# Patient Record
Sex: Male | Born: 1970 | Race: White | Hispanic: No | Marital: Single | Smoking: Current every day smoker
Health system: Southern US, Community
[De-identification: ages and names within clinical notes are randomized; demographics above are authoritative.]

## PROBLEM LIST (undated history)

## (undated) DIAGNOSIS — G47419 Narcolepsy without cataplexy: Secondary | ICD-10-CM

## (undated) DIAGNOSIS — Z21 Asymptomatic human immunodeficiency virus [HIV] infection status: Secondary | ICD-10-CM

## (undated) DIAGNOSIS — J302 Other seasonal allergic rhinitis: Secondary | ICD-10-CM

## (undated) DIAGNOSIS — F17200 Nicotine dependence, unspecified, uncomplicated: Secondary | ICD-10-CM

## (undated) DIAGNOSIS — B20 Human immunodeficiency virus [HIV] disease: Secondary | ICD-10-CM

## (undated) HISTORY — DX: Human immunodeficiency virus (HIV) disease: B20

## (undated) HISTORY — DX: Other seasonal allergic rhinitis: J30.2

## (undated) HISTORY — DX: Narcolepsy without cataplexy: G47.419

## (undated) HISTORY — DX: Asymptomatic human immunodeficiency virus (hiv) infection status: Z21

## (undated) HISTORY — DX: Nicotine dependence, unspecified, uncomplicated: F17.200

---

## 2001-04-24 ENCOUNTER — Encounter (INDEPENDENT_AMBULATORY_CARE_PROVIDER_SITE_OTHER): Payer: Self-pay | Admitting: *Deleted

## 2001-04-24 ENCOUNTER — Ambulatory Visit (HOSPITAL_COMMUNITY): Admission: RE | Admit: 2001-04-24 | Discharge: 2001-04-24 | Payer: Self-pay | Admitting: Infectious Diseases

## 2001-04-24 ENCOUNTER — Encounter: Admission: RE | Admit: 2001-04-24 | Discharge: 2001-04-24 | Payer: Self-pay | Admitting: Infectious Diseases

## 2001-04-24 LAB — CONVERTED CEMR LAB: CD4 T Cell Abs: 280

## 2001-05-15 ENCOUNTER — Ambulatory Visit (HOSPITAL_COMMUNITY): Admission: RE | Admit: 2001-05-15 | Discharge: 2001-05-15 | Payer: Self-pay | Admitting: Infectious Diseases

## 2001-05-15 ENCOUNTER — Encounter: Admission: RE | Admit: 2001-05-15 | Discharge: 2001-05-15 | Payer: Self-pay | Admitting: Internal Medicine

## 2001-05-15 ENCOUNTER — Encounter: Payer: Self-pay | Admitting: Infectious Disease

## 2001-05-15 LAB — CONVERTED CEMR LAB: HIV 1 RNA Quant: 18700 copies/mL

## 2001-07-19 ENCOUNTER — Encounter: Admission: RE | Admit: 2001-07-19 | Discharge: 2001-07-19 | Payer: Self-pay | Admitting: Infectious Diseases

## 2001-07-19 ENCOUNTER — Ambulatory Visit (HOSPITAL_COMMUNITY): Admission: RE | Admit: 2001-07-19 | Discharge: 2001-07-19 | Payer: Self-pay | Admitting: Infectious Diseases

## 2001-08-02 ENCOUNTER — Encounter: Admission: RE | Admit: 2001-08-02 | Discharge: 2001-08-02 | Payer: Self-pay | Admitting: Infectious Diseases

## 2002-03-19 ENCOUNTER — Ambulatory Visit (HOSPITAL_COMMUNITY): Admission: RE | Admit: 2002-03-19 | Discharge: 2002-03-19 | Payer: Self-pay | Admitting: Infectious Diseases

## 2002-03-19 ENCOUNTER — Encounter: Admission: RE | Admit: 2002-03-19 | Discharge: 2002-03-19 | Payer: Self-pay | Admitting: Infectious Diseases

## 2002-04-18 ENCOUNTER — Encounter: Admission: RE | Admit: 2002-04-18 | Discharge: 2002-04-18 | Payer: Self-pay | Admitting: Infectious Diseases

## 2002-08-21 ENCOUNTER — Encounter: Admission: RE | Admit: 2002-08-21 | Discharge: 2002-08-21 | Payer: Self-pay | Admitting: Infectious Diseases

## 2002-08-21 ENCOUNTER — Encounter (INDEPENDENT_AMBULATORY_CARE_PROVIDER_SITE_OTHER): Payer: Self-pay | Admitting: Infectious Diseases

## 2002-08-21 ENCOUNTER — Ambulatory Visit (HOSPITAL_COMMUNITY): Admission: RE | Admit: 2002-08-21 | Discharge: 2002-08-21 | Payer: Self-pay | Admitting: Infectious Diseases

## 2002-09-05 ENCOUNTER — Encounter: Admission: RE | Admit: 2002-09-05 | Discharge: 2002-09-05 | Payer: Self-pay | Admitting: Infectious Diseases

## 2003-01-14 ENCOUNTER — Ambulatory Visit (HOSPITAL_COMMUNITY): Admission: RE | Admit: 2003-01-14 | Discharge: 2003-01-14 | Payer: Self-pay | Admitting: Infectious Diseases

## 2003-01-14 ENCOUNTER — Encounter (INDEPENDENT_AMBULATORY_CARE_PROVIDER_SITE_OTHER): Payer: Self-pay | Admitting: Infectious Diseases

## 2003-01-14 ENCOUNTER — Encounter: Admission: RE | Admit: 2003-01-14 | Discharge: 2003-01-14 | Payer: Self-pay | Admitting: Infectious Diseases

## 2003-01-29 ENCOUNTER — Encounter: Admission: RE | Admit: 2003-01-29 | Discharge: 2003-01-29 | Payer: Self-pay | Admitting: Infectious Diseases

## 2003-04-22 ENCOUNTER — Encounter: Admission: RE | Admit: 2003-04-22 | Discharge: 2003-04-22 | Payer: Self-pay | Admitting: Infectious Diseases

## 2003-05-15 ENCOUNTER — Encounter: Admission: RE | Admit: 2003-05-15 | Discharge: 2003-05-15 | Payer: Self-pay | Admitting: Infectious Diseases

## 2003-05-15 ENCOUNTER — Ambulatory Visit (HOSPITAL_COMMUNITY): Admission: RE | Admit: 2003-05-15 | Discharge: 2003-05-15 | Payer: Self-pay | Admitting: Infectious Diseases

## 2003-06-05 ENCOUNTER — Encounter: Admission: RE | Admit: 2003-06-05 | Discharge: 2003-06-05 | Payer: Self-pay | Admitting: Infectious Diseases

## 2003-09-23 ENCOUNTER — Encounter: Admission: RE | Admit: 2003-09-23 | Discharge: 2003-09-23 | Payer: Self-pay | Admitting: Infectious Diseases

## 2003-09-23 ENCOUNTER — Ambulatory Visit (HOSPITAL_COMMUNITY): Admission: RE | Admit: 2003-09-23 | Discharge: 2003-09-23 | Payer: Self-pay | Admitting: Infectious Diseases

## 2003-10-07 ENCOUNTER — Encounter: Admission: RE | Admit: 2003-10-07 | Discharge: 2003-10-07 | Payer: Self-pay | Admitting: Infectious Diseases

## 2004-01-06 ENCOUNTER — Ambulatory Visit: Payer: Self-pay | Admitting: Infectious Diseases

## 2004-01-06 ENCOUNTER — Ambulatory Visit (HOSPITAL_COMMUNITY): Admission: RE | Admit: 2004-01-06 | Discharge: 2004-01-06 | Payer: Self-pay | Admitting: Infectious Diseases

## 2004-01-20 ENCOUNTER — Ambulatory Visit: Payer: Self-pay | Admitting: Infectious Diseases

## 2004-05-25 ENCOUNTER — Ambulatory Visit: Payer: Self-pay | Admitting: Infectious Diseases

## 2004-05-25 ENCOUNTER — Ambulatory Visit (HOSPITAL_COMMUNITY): Admission: RE | Admit: 2004-05-25 | Discharge: 2004-05-25 | Payer: Self-pay | Admitting: Infectious Diseases

## 2004-06-03 ENCOUNTER — Emergency Department (HOSPITAL_COMMUNITY): Admission: EM | Admit: 2004-06-03 | Discharge: 2004-06-03 | Payer: Self-pay | Admitting: Emergency Medicine

## 2004-06-08 ENCOUNTER — Ambulatory Visit: Payer: Self-pay | Admitting: Infectious Diseases

## 2004-10-26 ENCOUNTER — Encounter (INDEPENDENT_AMBULATORY_CARE_PROVIDER_SITE_OTHER): Payer: Self-pay | Admitting: *Deleted

## 2004-10-26 ENCOUNTER — Ambulatory Visit (HOSPITAL_COMMUNITY): Admission: RE | Admit: 2004-10-26 | Discharge: 2004-10-26 | Payer: Self-pay | Admitting: Infectious Diseases

## 2004-10-26 ENCOUNTER — Ambulatory Visit: Payer: Self-pay | Admitting: Infectious Diseases

## 2004-10-26 LAB — CONVERTED CEMR LAB
CD4 Count: 500 uL
HIV 1 RNA Quant: 49 {copies}/mL

## 2004-11-09 ENCOUNTER — Ambulatory Visit: Payer: Self-pay | Admitting: Infectious Diseases

## 2005-05-19 ENCOUNTER — Encounter: Admission: RE | Admit: 2005-05-19 | Discharge: 2005-05-19 | Payer: Self-pay | Admitting: Infectious Diseases

## 2005-05-19 ENCOUNTER — Ambulatory Visit: Payer: Self-pay | Admitting: Infectious Diseases

## 2005-05-19 ENCOUNTER — Encounter (INDEPENDENT_AMBULATORY_CARE_PROVIDER_SITE_OTHER): Payer: Self-pay | Admitting: *Deleted

## 2005-05-19 LAB — CONVERTED CEMR LAB: CD4 Count: 510 microliters

## 2005-06-02 ENCOUNTER — Ambulatory Visit: Payer: Self-pay | Admitting: Infectious Diseases

## 2005-11-02 ENCOUNTER — Encounter (INDEPENDENT_AMBULATORY_CARE_PROVIDER_SITE_OTHER): Payer: Self-pay | Admitting: *Deleted

## 2005-11-02 ENCOUNTER — Encounter: Admission: RE | Admit: 2005-11-02 | Discharge: 2005-11-02 | Payer: Self-pay | Admitting: Infectious Diseases

## 2005-11-02 ENCOUNTER — Ambulatory Visit: Payer: Self-pay | Admitting: Infectious Diseases

## 2005-11-02 LAB — CONVERTED CEMR LAB: HIV 1 RNA Quant: 49 copies/mL

## 2005-11-16 ENCOUNTER — Ambulatory Visit: Payer: Self-pay | Admitting: Infectious Diseases

## 2005-11-16 DIAGNOSIS — B20 Human immunodeficiency virus [HIV] disease: Secondary | ICD-10-CM

## 2006-04-11 ENCOUNTER — Encounter (INDEPENDENT_AMBULATORY_CARE_PROVIDER_SITE_OTHER): Payer: Self-pay | Admitting: *Deleted

## 2006-04-11 LAB — CONVERTED CEMR LAB

## 2006-04-24 ENCOUNTER — Encounter (INDEPENDENT_AMBULATORY_CARE_PROVIDER_SITE_OTHER): Payer: Self-pay | Admitting: *Deleted

## 2006-06-10 ENCOUNTER — Encounter (INDEPENDENT_AMBULATORY_CARE_PROVIDER_SITE_OTHER): Payer: Self-pay | Admitting: Infectious Diseases

## 2006-06-20 ENCOUNTER — Encounter: Admission: RE | Admit: 2006-06-20 | Discharge: 2006-06-20 | Payer: Self-pay | Admitting: Infectious Diseases

## 2006-06-20 ENCOUNTER — Ambulatory Visit: Payer: Self-pay | Admitting: Infectious Diseases

## 2006-07-01 IMAGING — CR DG CHEST 2V
2 series · 2 of 2 positions shown · non-contrast
Comparison: none

CLINICAL DATA: Chest pain

Chest 2 view:
No previous for comparison. The heart size and mediastinal contours are within
normal limits.  Both lungs are clear.  The visualized skeletal structures are
unremarkable.

[w chest pa]
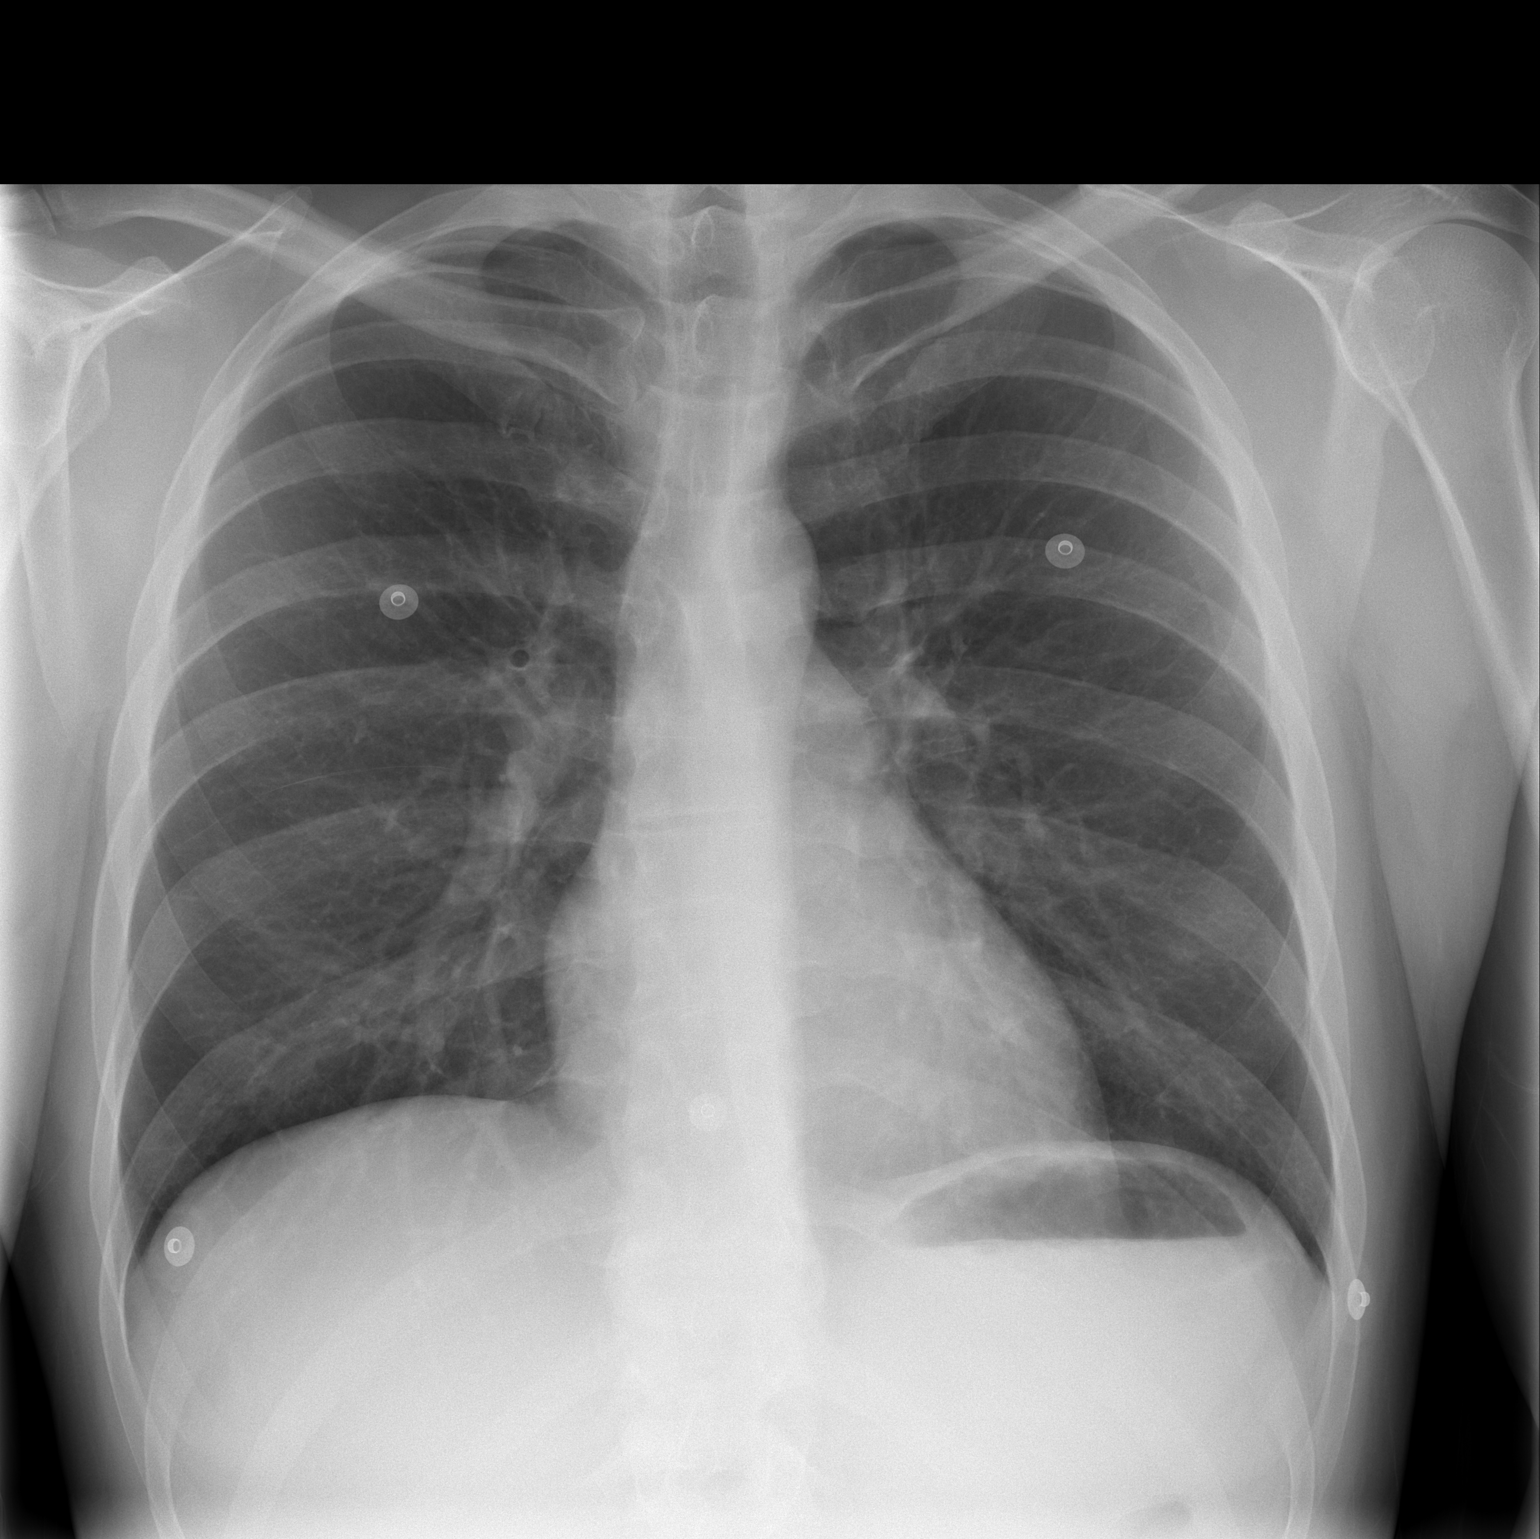

[w chest lat]
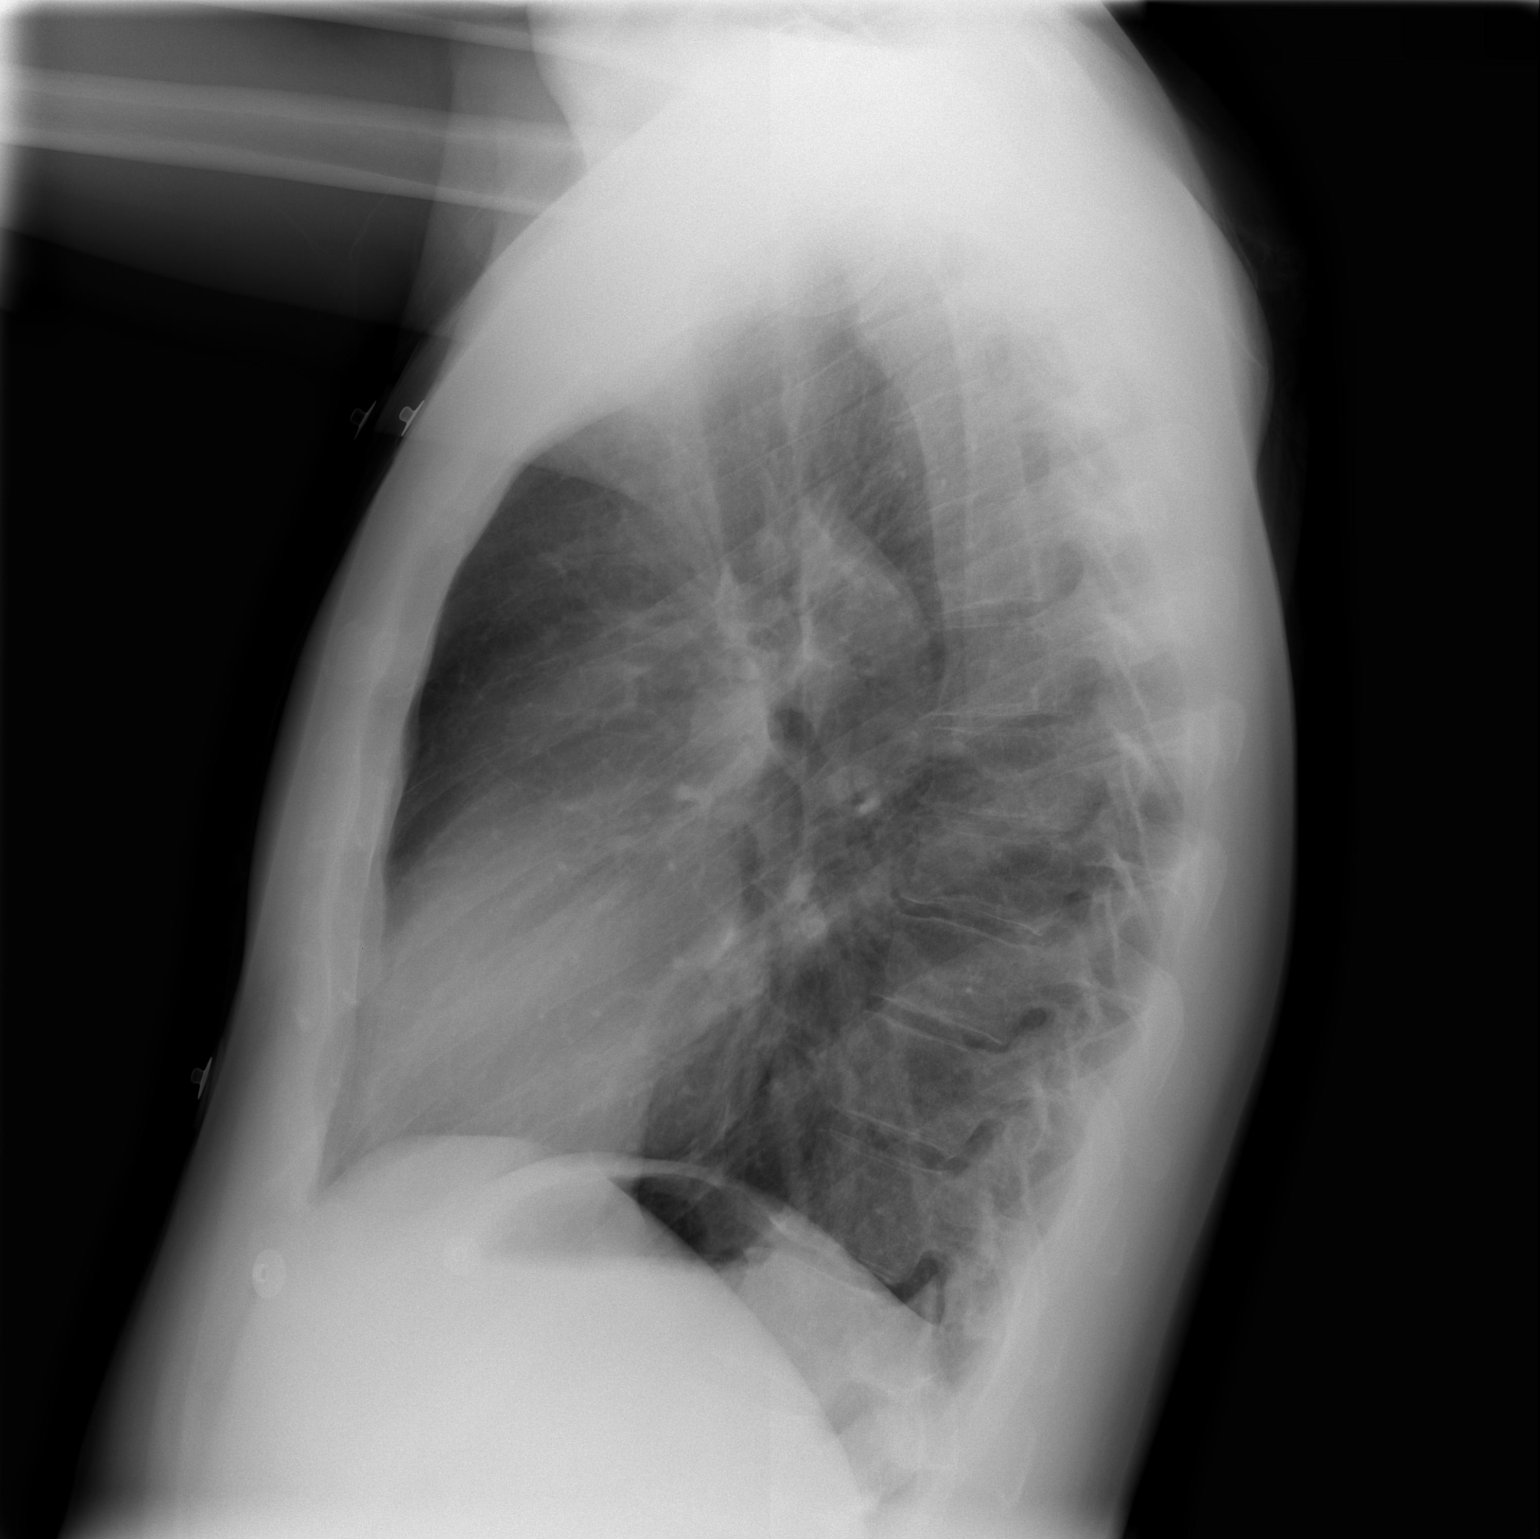

[2 of 2 positions shown; findings below may reference images not displayed]

IMPRESSION: 1. No active cardiopulmonary disease.

## 2006-07-04 ENCOUNTER — Encounter (INDEPENDENT_AMBULATORY_CARE_PROVIDER_SITE_OTHER): Payer: Self-pay | Admitting: *Deleted

## 2006-07-04 ENCOUNTER — Ambulatory Visit: Payer: Self-pay | Admitting: Infectious Diseases

## 2006-08-05 ENCOUNTER — Encounter (INDEPENDENT_AMBULATORY_CARE_PROVIDER_SITE_OTHER): Payer: Self-pay | Admitting: *Deleted

## 2006-08-22 ENCOUNTER — Encounter (INDEPENDENT_AMBULATORY_CARE_PROVIDER_SITE_OTHER): Payer: Self-pay | Admitting: *Deleted

## 2006-11-30 ENCOUNTER — Encounter: Admission: RE | Admit: 2006-11-30 | Discharge: 2006-11-30 | Payer: Self-pay | Admitting: Infectious Disease

## 2006-11-30 ENCOUNTER — Ambulatory Visit: Payer: Self-pay | Admitting: Infectious Disease

## 2006-11-30 LAB — CONVERTED CEMR LAB
ALT: 16 units/L (ref 0–53)
Albumin: 4.7 g/dL (ref 3.5–5.2)
Basophils Absolute: 0 10*3/uL (ref 0.0–0.1)
Basophils Relative: 1 % (ref 0–1)
CO2: 24 meq/L (ref 19–32)
Calcium: 9.6 mg/dL (ref 8.4–10.5)
Eosinophils Absolute: 0.3 10*3/uL (ref 0.0–0.7)
HCT: 44.9 % (ref 39.0–52.0)
HIV 1 RNA Quant: 93 copies/mL — ABNORMAL HIGH (ref <50)
Lymphs Abs: 2.2 10*3/uL (ref 0.7–3.3)
MCV: 97.2 fL (ref 78.0–100.0)
Monocytes Absolute: 0.5 10*3/uL (ref 0.2–0.7)
Platelets: 228 10*3/uL (ref 150–400)
RDW: 13.7 % (ref 11.5–14.0)
Sodium: 139 meq/L (ref 135–145)
Total Bilirubin: 0.4 mg/dL (ref 0.3–1.2)
Total Protein: 7.6 g/dL (ref 6.0–8.3)

## 2006-12-20 ENCOUNTER — Telehealth: Payer: Self-pay | Admitting: Infectious Disease

## 2006-12-29 ENCOUNTER — Ambulatory Visit: Payer: Self-pay | Admitting: Infectious Disease

## 2006-12-29 DIAGNOSIS — G47419 Narcolepsy without cataplexy: Secondary | ICD-10-CM | POA: Insufficient documentation

## 2006-12-29 DIAGNOSIS — F39 Unspecified mood [affective] disorder: Secondary | ICD-10-CM | POA: Insufficient documentation

## 2006-12-29 DIAGNOSIS — F17200 Nicotine dependence, unspecified, uncomplicated: Secondary | ICD-10-CM

## 2007-02-02 ENCOUNTER — Telehealth: Payer: Self-pay | Admitting: Infectious Disease

## 2007-02-07 ENCOUNTER — Telehealth: Payer: Self-pay | Admitting: Infectious Disease

## 2007-02-13 ENCOUNTER — Encounter (INDEPENDENT_AMBULATORY_CARE_PROVIDER_SITE_OTHER): Payer: Self-pay | Admitting: *Deleted

## 2007-02-13 ENCOUNTER — Telehealth: Payer: Self-pay | Admitting: Infectious Disease

## 2007-02-14 ENCOUNTER — Encounter: Payer: Self-pay | Admitting: Infectious Disease

## 2007-02-14 ENCOUNTER — Encounter (INDEPENDENT_AMBULATORY_CARE_PROVIDER_SITE_OTHER): Payer: Self-pay | Admitting: *Deleted

## 2007-06-26 ENCOUNTER — Telehealth (INDEPENDENT_AMBULATORY_CARE_PROVIDER_SITE_OTHER): Payer: Self-pay | Admitting: *Deleted

## 2007-07-19 ENCOUNTER — Ambulatory Visit: Payer: Self-pay | Admitting: Infectious Disease

## 2007-07-19 ENCOUNTER — Encounter: Admission: RE | Admit: 2007-07-19 | Discharge: 2007-07-19 | Payer: Self-pay | Admitting: Infectious Disease

## 2007-07-19 LAB — CONVERTED CEMR LAB
BUN: 13 mg/dL (ref 6–23)
CO2: 23 meq/L (ref 19–32)
Creatinine, Ser: 1.01 mg/dL (ref 0.40–1.50)
Eosinophils Absolute: 0.2 10*3/uL (ref 0.0–0.7)
Glucose, Bld: 101 mg/dL — ABNORMAL HIGH (ref 70–99)
HDL: 49 mg/dL (ref >39)
HIV 1 RNA Quant: <50 copies/mL (ref <50)
HIV-1 RNA Quant, Log: <1.7 (ref <1.70)
Hemoglobin: 13.7 g/dL (ref 13.0–17.0)
LDL Cholesterol: 116 mg/dL — ABNORMAL HIGH (ref 0–99)
Lymphocytes Relative: 31 % (ref 12–46)
Lymphs Abs: 1.7 10*3/uL (ref 0.7–4.0)
Monocytes Relative: 8 % (ref 3–12)
Neutro Abs: 3.1 10*3/uL (ref 1.7–7.7)
Neutrophils Relative %: 57 % (ref 43–77)
Platelets: 234 10*3/uL (ref 150–400)
Potassium: 4 meq/L (ref 3.5–5.3)
RBC: 4.34 M/uL (ref 4.22–5.81)
Total Bilirubin: 0.3 mg/dL (ref 0.3–1.2)
Total Protein: 7.4 g/dL (ref 6.0–8.3)
Triglycerides: 97 mg/dL (ref <150)
VLDL: 19 mg/dL (ref 0–40)

## 2007-08-02 ENCOUNTER — Ambulatory Visit: Payer: Self-pay | Admitting: Infectious Disease

## 2007-08-02 DIAGNOSIS — B029 Zoster without complications: Secondary | ICD-10-CM | POA: Insufficient documentation

## 2007-08-10 ENCOUNTER — Telehealth: Payer: Self-pay | Admitting: Infectious Diseases

## 2007-09-29 ENCOUNTER — Encounter: Payer: Self-pay | Admitting: Infectious Disease

## 2007-12-27 ENCOUNTER — Ambulatory Visit: Payer: Self-pay | Admitting: Infectious Disease

## 2007-12-27 LAB — CONVERTED CEMR LAB
AST: 16 units/L (ref 0–37)
Albumin: 4.4 g/dL (ref 3.5–5.2)
Alkaline Phosphatase: 76 units/L (ref 39–117)
BUN: 9 mg/dL (ref 6–23)
Calcium: 9 mg/dL (ref 8.4–10.5)
Chloride: 104 meq/L (ref 96–112)
Creatinine, Ser: 0.75 mg/dL (ref 0.40–1.50)
Eosinophils Absolute: 0.3 10*3/uL (ref 0.0–0.7)
Eosinophils Relative: 4 % (ref 0–5)
GC Probe Amp, Urine: NEGATIVE
Glucose, Bld: 86 mg/dL (ref 70–99)
HIV 1 RNA Quant: 92 copies/mL — ABNORMAL HIGH (ref <48)
Lymphs Abs: 1.5 10*3/uL (ref 0.7–4.0)
Monocytes Absolute: 0.5 10*3/uL (ref 0.1–1.0)
Neutro Abs: 4.7 10*3/uL (ref 1.7–7.7)
Neutrophils Relative %: 68 % (ref 43–77)
Sodium: 138 meq/L (ref 135–145)

## 2008-01-18 ENCOUNTER — Ambulatory Visit: Payer: Self-pay | Admitting: Infectious Disease

## 2008-10-10 ENCOUNTER — Ambulatory Visit: Payer: Self-pay | Admitting: Infectious Disease

## 2008-10-10 LAB — CONVERTED CEMR LAB
Albumin: 4.4 g/dL (ref 3.5–5.2)
Basophils Absolute: 0 10*3/uL (ref 0.0–0.1)
Basophils Relative: 0 % (ref 0–1)
CO2: 24 meq/L (ref 19–32)
Chloride: 106 meq/L (ref 96–112)
Creatinine, Ser: 0.94 mg/dL (ref 0.40–1.50)
Eosinophils Absolute: 0.2 10*3/uL (ref 0.0–0.7)
HCT: 38.9 % — ABNORMAL LOW (ref 39.0–52.0)
HIV 1 RNA Quant: <48 copies/mL (ref <48)
HIV-1 RNA Quant, Log: <1.68 (ref <1.68)
Hemoglobin: 13.5 g/dL (ref 13.0–17.0)
LDL Cholesterol: 92 mg/dL (ref 0–99)
Lymphs Abs: 1.6 10*3/uL (ref 0.7–4.0)
Monocytes Absolute: 0.5 10*3/uL (ref 0.1–1.0)
Monocytes Relative: 6 % (ref 3–12)
Platelets: 216 10*3/uL (ref 150–400)
Potassium: 3.6 meq/L (ref 3.5–5.3)
RBC: 4.17 M/uL — ABNORMAL LOW (ref 4.22–5.81)
Sodium: 143 meq/L (ref 135–145)
Total Bilirubin: 0.4 mg/dL (ref 0.3–1.2)
Total Protein: 7.1 g/dL (ref 6.0–8.3)
VLDL: 14 mg/dL (ref 0–40)

## 2008-10-28 ENCOUNTER — Ambulatory Visit: Payer: Self-pay | Admitting: Infectious Disease

## 2009-07-02 ENCOUNTER — Ambulatory Visit: Payer: Self-pay | Admitting: Infectious Disease

## 2009-07-02 LAB — CONVERTED CEMR LAB
Alkaline Phosphatase: 63 units/L (ref 39–117)
BUN: 10 mg/dL (ref 6–23)
Bilirubin Urine: NEGATIVE
CO2: 23 meq/L (ref 19–32)
Chloride: 105 meq/L (ref 96–112)
Cholesterol: 173 mg/dL (ref 0–200)
Eosinophils Absolute: 0.1 10*3/uL (ref 0.0–0.7)
Eosinophils Relative: 1 % (ref 0–5)
Glucose, Bld: 92 mg/dL (ref 70–99)
HIV 1 RNA Quant: <48 copies/mL (ref <48)
Ketones, ur: NEGATIVE mg/dL
LDL Cholesterol: 112 mg/dL — ABNORMAL HIGH (ref 0–99)
Leukocytes, UA: NEGATIVE
Lymphocytes Relative: 20 % (ref 12–46)
Lymphs Abs: 1.8 10*3/uL (ref 0.7–4.0)
MCHC: 35 g/dL (ref 30.0–36.0)
MCV: 93.2 fL (ref 78.0–100.0)
Monocytes Absolute: 0.6 10*3/uL (ref 0.1–1.0)
Monocytes Relative: 6 % (ref 3–12)
Neutro Abs: 6.4 10*3/uL (ref 1.7–7.7)
Nitrite: NEGATIVE
Platelets: 226 10*3/uL (ref 150–400)
Protein, ur: NEGATIVE mg/dL
RBC: 4.39 M/uL (ref 4.22–5.81)
Total CHOL/HDL Ratio: 3.8
Urobilinogen, UA: 0.2 (ref 0.0–1.0)
VLDL: 16 mg/dL (ref 0–40)
WBC: 8.9 10*3/uL (ref 4.0–10.5)

## 2009-07-21 ENCOUNTER — Ambulatory Visit: Payer: Self-pay | Admitting: Infectious Disease

## 2009-07-21 DIAGNOSIS — J0110 Acute frontal sinusitis, unspecified: Secondary | ICD-10-CM

## 2009-07-21 LAB — CONVERTED CEMR LAB: Cholesterol, target level: 200 mg/dL

## 2009-10-28 ENCOUNTER — Telehealth: Payer: Self-pay | Admitting: Infectious Disease

## 2010-03-15 LAB — CONVERTED CEMR LAB
Albumin: 4.6 g/dL (ref 3.5–5.2)
Basophils Absolute: 0 10*3/uL (ref 0.0–0.1)
Basophils Relative: 1 % (ref 0–1)
CO2: 24 meq/L (ref 19–32)
Calcium: 9.2 mg/dL (ref 8.4–10.5)
Chloride: 102 meq/L (ref 96–112)
Cholesterol: 168 mg/dL (ref 0–200)
HCT: 44.4 % (ref 39.0–52.0)
HDL: 47 mg/dL (ref >39)
Hemoglobin, Urine: NEGATIVE
Ketones, ur: NEGATIVE mg/dL
MCV: 94.9 fL (ref 78.0–100.0)
Monocytes Absolute: 0.3 10*3/uL (ref 0.2–0.7)
Neutrophils Relative %: 63 % (ref 43–77)
Platelets: 290 10*3/uL (ref 150–400)
RBC: 4.68 M/uL (ref 4.22–5.81)
Testosterone: 292.43 ng/dL — ABNORMAL LOW (ref 350–890)
Total Bilirubin: 0.4 mg/dL (ref 0.3–1.2)
Triglycerides: 121 mg/dL (ref <150)
Urine Glucose: NEGATIVE mg/dL
Urobilinogen, UA: 0.2 (ref 0.0–1.0)
pH: 5.5 (ref 5.0–8.0)

## 2010-03-17 NOTE — Progress Notes (Signed)
Summary: pt need assistance  Phone Note Call from Patient   Caller: Patient Details for Reason: need assistance with Atripla Summary of Call: Patient just found out that he will no longer be able to use a local retail pharmacy.  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 He is asking for a sample of medication to get him by.  Patient is on his way to pick up a bottle of Atripla  Sample Given, Lot #:[id number] Expiration Date:05 2013 Patient has been instructed regarding the correct time, dose and frequency of taking this med, including desired effects and most common side effects.  Initial call taken by: Paulo Fruit  BS,CPht II,MPH,  October 28, 2009 10:52 AM  Follow-up for Phone Call        Patient came to pick up the sample bottle of Atripla Follow-up by: Paulo Fruit  BS,CPht II,MPH,  October 28, 2009 3:26 PM

## 2010-03-17 NOTE — Assessment & Plan Note (Signed)
Summary: F/U [MKJ]   Visit Type:  Follow-up  CC:  sinus congestion, chest congestion, coughing up yeloow-brown phlegm sxs x 1 week, and Lipid Management.  History of Present Illness: 40 year old Caucasian male with HIV with undetectable viral load, healthy cd4 count, who is working on stopping smoking with his partner. He has had trouble with sinus congestion for the past 10 days. He tried to manage this with sudafed with only temporary success. He no has facial pain but no fevers.   Lipid Management History:      Positive NCEP/ATP III risk factors include current tobacco user.  Negative NCEP/ATP III risk factors include male age less than 6 years old and no family history for ischemic heart disease.    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 One Pill At Bedtime  Current Medications (verified): 1)  Atripla 600-200-300 Mg Tabs (Efavirenz-Emtricitab-Tenofovir) .... One Pill At Bedtime 2)  Azithromycin 250 Mg Tabs (Azithromycin) .... Two Tablets First Day Then One Table Once Daily For 4 Days  Allergies (verified): No Known Drug Allergies    Preventive Screening-Counseling & Management  Alcohol-Tobacco     Alcohol drinks/day: socially     Alcohol type: wine     Smoking Status: current     Packs/Day: 1.0   Current Allergies (reviewed today): No known allergies  Family History: Reviewed history from 12/29/2006 and no changes required. No early cad  Social History: Reviewed history from 12/29/2006 and no changes required. Domestic Partner Current Smoker Alcohol use-yes  Review of Systems  The patient denies anorexia, fever, weight loss, weight  gain, vision loss, decreased hearing, hoarseness, chest pain, syncope, dyspnea on exertion, peripheral edema, prolonged cough, headaches, hemoptysis, abdominal pain, melena, hematochezia, severe indigestion/heartburn, hematuria, incontinence, genital sores, muscle weakness, suspicious skin lesions, transient blindness, difficulty walking, depression, unusual weight change, abnormal bleeding, and enlarged lymph nodes.    Vital Signs:  Patient profile:   40 year old male Height:      76 inches (193.04 cm) Weight:      199 pounds (90.45 kg) BMI:     24.31 Temp:     98.4 degrees F (36.89 degrees C) oral Pulse rate:   94 / minute BP sitting:   128 / 76  (left arm)  Vitals Entered By: Starleen Arms CMA (July 21, 2009 10:50 AM) CC: sinus congestion, chest congestion, coughing up yeloow-brown phlegm sxs x 1 week, Lipid Management Is Patient Diabetic? No Pain Assessment Patient in pain? no      Nutritional Status BMI of 19 -24 = normal  Does patient need assistance? Functional Status Self care Ambulation Normal        Medication Adherence: 07/21/2009   Adherence to medications reviewed with patient. Counseling to provide adequate adherence provided   Prevention For Positives: 07/21/2009   Safe sex practices discussed with patient. Condoms offered.   Education Materials Provided: 07/21/2009 Safe sex practices discussed with patient. Condoms offered.                          Physical Exam  General:  alert and well-developed.   Head:  normocephalic and atraumatic.   Eyes:  vision grossly intact and pupils equal.   Ears:  no external deformities.   Nose:  nasal erythema Mouth:  good dentition, no gingival abnormalities, pharynx pink and moist, and no erythema.  no exudates Neck:  supple and full ROM.   Lungs:  normal respiratory effort, no crackles, and no wheezes.   Heart:  normal rate, regular rhythm, no murmur, and no gallop.   Abdomen:  soft, non-tender, no  distention Msk:  normal ROM.  no joint deformities.   Extremities:  trace left pedal edema and trace right pedal edema.   Neurologic:  alert & oriented X3.  strength normal in all extremities and gait normal.   Skin:  turgor normal and no rashes.   Psych:  Oriented X3, memory intact for recent and remote, normally interactive, and good eye contact.     Impression & Recommendations:  Problem # 1:  HIV DISEASE (ICD-042) Assessment Improved  Excellent control! 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 Patient Level IV ([address])  Problem # 2:  SMOKER (ICD-305.1)  he is going to work ion stoping with his partner who also smokes  Orders: Est. Patient Level IV (98119)  Problem # 3:  SINUSITIS, FRONTAL, ACUTE (ICD-461.1)  will give him a zpack (he prefers azithromycin). COunselled him to use deongestants with future sinus congestion to use decongestants and to try anti allergy meds such as claritin as some of these initial episodes seems to be triggred by dust and irritants His updated medication list for this problem includes:    Azithromycin 250 Mg Tabs (Azithromycin) .Marland Kitchen..Marland Kitchen Two tablets first day then one table once daily for 4 days  Orders: Est. Patient Level IV (14782)  Problem # 4:  SEASONAL AFFECTIVE DISORDER (ICD-296.90)  stable  Orders: Est. Patient Level IV (95621)  Medications Added to Medication List This Visit: 1)  Azithromycin 250 Mg Tabs (Azithromycin) .... Two tablets first day then one table once daily for 4 days  Other Orders: Future Orders: T-CD4SP ([facility]) (CD4SP) ...  01/17/2010 T-HIV Viral Load [id number]) ... 01/17/2010 T-CBC w/Diff ([id number]) ... 01/17/2010 T-RPR (Syphilis) [phone]) ... 01/17/2010 T-Lipid Profile [id number]) ... 01/17/2010 T-Comprehensive Metabolic Panel [id number]) ... 01/17/2010  Lipid Assessment/Plan:      Based on NCEP/ATP III, the patient's risk factor category is "0-1 risk factors".  The patient's lipid goals are as follows: Total cholesterol goal is 200; LDL cholesterol goal is 160; HDL cholesterol goal is 40; Triglyceride goal is 150.    Patient Instructions: 1)  Please schedule a follow-up appointment in 6 months.   Prescriptions: AZITHROMYCIN 250 MG TABS (AZITHROMYCIN) two tablets first day then one table once daily for 4 days  #6 x 0   Entered and Authorized by:   Acey Lav MD   Signed by:   Paulette Blanch Dam MD on 07/21/2009   Method used:   [facility] to        [facility] [phone]* (retail)       [address]       [place], [place]  [address]       Ph: [phone]       Fax: [phone]   RxID:   [id number]

## 2010-04-15 ENCOUNTER — Other Ambulatory Visit: Payer: Self-pay | Admitting: Infectious Disease

## 2010-04-15 ENCOUNTER — Encounter: Payer: Self-pay | Admitting: Infectious Disease

## 2010-04-15 ENCOUNTER — Other Ambulatory Visit (INDEPENDENT_AMBULATORY_CARE_PROVIDER_SITE_OTHER): Payer: BC Managed Care – PPO

## 2010-04-15 DIAGNOSIS — B20 Human immunodeficiency virus [HIV] disease: Secondary | ICD-10-CM

## 2010-04-15 LAB — CONVERTED CEMR LAB
Albumin: 4.4 g/dL (ref 3.5–5.2)
BUN: 11 mg/dL (ref 6–23)
Basophils Absolute: 0 10*3/uL (ref 0.0–0.1)
CO2: 26 meq/L (ref 19–32)
HIV 1 RNA Quant: <20 copies/mL (ref <20)
HIV-1 RNA Quant, Log: <1.3 (ref <1.30)
Hemoglobin: 13.7 g/dL (ref 13.0–17.0)
LDL Cholesterol: 105 mg/dL — ABNORMAL HIGH (ref 0–99)
MCHC: 33.9 g/dL (ref 30.0–36.0)
MCV: 96.2 fL (ref 78.0–100.0)
Monocytes Relative: 8 % (ref 3–12)
Neutro Abs: 6.1 10*3/uL (ref 1.7–7.7)
Platelets: 229 10*3/uL (ref 150–400)
Potassium: 4.2 meq/L (ref 3.5–5.3)
RBC: 4.2 M/uL — ABNORMAL LOW (ref 4.22–5.81)
Sodium: 138 meq/L (ref 135–145)
VLDL: 27 mg/dL (ref 0–40)

## 2010-04-16 LAB — T-HELPER CELL (CD4) - (RCID CLINIC ONLY): CD4 T Cell Abs: 720 uL (ref 400–2700)

## 2010-04-29 ENCOUNTER — Encounter: Payer: Self-pay | Admitting: Infectious Disease

## 2010-04-29 ENCOUNTER — Ambulatory Visit (INDEPENDENT_AMBULATORY_CARE_PROVIDER_SITE_OTHER): Payer: BC Managed Care – PPO | Admitting: Infectious Disease

## 2010-04-29 DIAGNOSIS — J301 Allergic rhinitis due to pollen: Secondary | ICD-10-CM | POA: Insufficient documentation

## 2010-04-29 DIAGNOSIS — J0110 Acute frontal sinusitis, unspecified: Secondary | ICD-10-CM

## 2010-04-29 DIAGNOSIS — F17200 Nicotine dependence, unspecified, uncomplicated: Secondary | ICD-10-CM

## 2010-04-29 DIAGNOSIS — B20 Human immunodeficiency virus [HIV] disease: Secondary | ICD-10-CM

## 2010-05-04 LAB — T-HELPER CELL (CD4) - (RCID CLINIC ONLY): CD4 % Helper T Cell: 39 % (ref 33–55)

## 2010-05-05 NOTE — Assessment & Plan Note (Signed)
Summary: 27month f/u   Vital Signs:  Patient profile:   40 year old male Height:      76 inches (193.04 cm) Weight:      207.8 pounds (94.45 kg) BMI:     25.39 Temp:     98.3 degrees F (36.83 degrees C) oral Pulse rate:   72 / minute BP sitting:   133 / 76  (right arm)  Vitals Entered By: Wendall Mola CMA Duncan Dull) (April 29, 2010 9:40 AM) CC: follow-up visit, lab results, Lipid Management Is Patient Diabetic? No Pain Assessment Patient in pain? no      Nutritional Status BMI of 25 - 29 = overweight Nutritional Status Detail appetite "good"  Have you ever been in a relationship where you felt threatened, hurt or afraid?No   Does patient need assistance? Functional Status Self care Ambulation Normal Comments no missed doses of Atripla per pt.   Visit Type:  Follow-up Primary Provider:  Paulette Blanch Dam MD  CC:  follow-up visit, lab results, and Lipid Management.  History of Present Illness: 40 year old Caucasian male with HIV with undetectable viral load, healthy cd4 count who returns for followup. He is doing well. He was initially reluctant to have repeat flu vacciantion as he claiimed he felt like he developed flu like ssx after last dose of flu shot for 24 hours. I assured him that this was likely simply evidence of the vaccine being effective and he agreed to flu vax. he is still smoking but is having trouble quitting because his partner still smokes. He and his partner are in a monogamous relationship and he claims that his partner is also well controlled on [place]. He admits that they are not always using protectiong. I stressed to him need to still use condoms due to the risk of virus still being prsent in semen and especially if one of them was non perfectly controlled. He contnues to ahve some problems with seasonal allergies and I proposed intranasal steroid for this.   Lipid Management History:      Positive NCEP/ATP III risk factors include current tobacco  user.  Negative NCEP/ATP III risk factors include male age less than 56 years old, no family history for ischemic heart disease, non-hypertensive, no ASHD (atherosclerotic heart disease), no prior stroke/TIA, no peripheral vascular disease, and no history of aortic aneurysm.     Preventive Screening-Counseling & Management  Alcohol-Tobacco     Alcohol drinks/day: socially     Alcohol type: wine     Smoking Status: current     Packs/Day: 1.0  Caffeine-Diet-Exercise     Caffeine use/day: coffee and tea     Does Patient Exercise: yes     Type of exercise: yardwork  Safety-Violence-Falls     Seat Belt Use: yes  Comments: pt. 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 One Pill At Bedtime 2)  Azithromycin 250 Mg Tabs (Azithromycin) .... Two Tablets First Day Then One Table Once Daily For 4 Days  Current Medications (verified): 1)  Atripla 600-200-300 Mg Tabs (Efavirenz-Emtricitab-Tenofovir) .... One Pill At Bedtime 2)  Fluticasone Propionate 50 Mcg/act Susp (Fluticasone Propionate) .Marland KitchenMarland KitchenMarland Kitchen  Take Two Puffs Each Nostril At Night For Allergies  Allergies (verified): No Known Drug Allergies  Past History:  Past Medical History: Last updated: 12/29/2006 HIV disease Idiopathic diurnal somnonlence Smoking Seasonal affective disorder  Family History: Last updated: 12/29/2006 No early cad  Social History: Last updated: 12/29/2006 Domestic Partner Current Smoker Alcohol use-yes  Risk Factors: Alcohol Use: socially (04/29/2010) Caffeine Use: coffee and tea (04/29/2010) Exercise: yes  (04/29/2010)  Risk Factors: Smoking Status: current (04/29/2010) Packs/Day: 1.0 (04/29/2010)  Review of Systems       see HPI otherwise negative on 12 pt review  Physical Exam  General:  alert and well-developed.   Head:  normocephalic and atraumatic.   Eyes:  vision grossly intact and pupils equal.   Ears:  no external deformities.   Nose:  nasal erythema Mouth:  good dentition, no gingival abnormalities, pharynx pink and moist, and no erythema.  no exudates Lungs:  normal respiratory effort, no crackles, and no wheezes.   Heart:  normal rate, regular rhythm, no murmur, and no gallop.   Abdomen:  soft, non-tender, no distention Msk:  normal ROM.  no joint deformities.   Extremities:  trace left pedal edema and trace right pedal edema.   Neurologic:  alert & oriented X3.  strength normal in all extremities and gait normal.   Skin:  turgor normal and no rashes.   Psych:  Oriented X3, memory intact for recent and remote, normally interactive, and good eye contact.     Impression & Recommendations:  Problem # 1:  HIV DISEASE (ICD-042)  perfect suppression The following medications were removed from the medication list:    Azithromycin 250 Mg Tabs (Azithromycin) .Marland Kitchen..Marland Kitchen Two tablets first day then one table once daily for 4 days  Orders: Est. Patient Level IV (53664)  Problem # 2:  SMOKER (ICD-305.1)  counselled to quit  Orders: Est. Patient Level IV ([address]347)  Problem # 3:  ALLERGIC RHINITIS, SEASONAL (ICD-477.0)  start inhaled steroid  Orders: Est. Patient Level IV (42595)  Medications Added to Medication List This Visit: 1)  Fluticasone Propionate 50 Mcg/act Susp (Fluticasone propionate) .... Take two puffs each nostril at night for allergies  Other Orders: Influenza Vaccine NON MCR (63875) Future Orders: T-CD4SP ([facility]) (CD4SP) ... 10/26/2010 T-HIV Viral Load [id number]) ... 10/26/2010 T-CBC w/Diff ([id number]) ... 10/26/2010 T-Comprehensive Metabolic  Panel ([id number]) ... 10/26/2010 T-RPR (Syphilis) [id number]) ... 10/26/2010 T-Lipid Profile [id number]) ... 10/26/2010  Lipid Assessment/Plan:      Based on NCEP/ATP III, the patient's risk factor category is "0-1 risk factors".  The patient's lipid goals are as follows: Total cholesterol goal is 200; LDL cholesterol goal is 160; HDL cholesterol goal is 40; Triglyceride goal is 150.  His LDL cholesterol goal has been met.    Patient Instructions: 1)  Please schedule a follow-up appointment in 6 months. 2)  Advised not to eat any food or drink any liquids after 10 PM the night before procedure. 3)  Be sure to return for lab work one (1) week before your next appointment as scheduled.  Prescriptions: FLUTICASONE PROPIONATE 50 MCG/ACT SUSP (FLUTICASONE PROPIONATE) take two puffs each nostril at night for allergies  #1 x 4   Entered and Authorized by:   Acey Lav MD   Signed by:   Paulette Blanch Dam MD on 04/29/2010   Method used:   [facility]        [facility] [phone][facility] (retail)       [address]  [place], [place]  [address]       Ph: [phone]       Fax: [phone]   RxID:   [id number]    Orders Added: 1)  T-CD4SP [facility]) [CD4SP] 2)  T-HIV Viral Load [phone] 3)  T-CBC w/Diff [[id number]] 4)  T-Comprehensive Metabolic Panel [[id number]] 5)  T-RPR (Syphilis) [phone] 6)  T-Lipid Profile [[id number]] 7)  Influenza Vaccine NON MCR [00028] 8)  Est. Patient Level IV [32951]   Immunizations Administered:  Influenza Vaccine # 1:    Vaccine Type: Fluvax Non-MCR    Site: right deltoid    Mfr: GlaxoSmithKline    Dose: 0.5 ml    Route: IM    Given by: Wendall Mola CMA ( AAMA)    Exp. Date: 08/15/2010    Lot #: [id number]    VIS given: 09/09/09 version given April 29, 2010.  Flu Vaccine Consent Questions:    Do you have a history of severe allergic reactions to this vaccine? no    Any prior  history of allergic reactions to egg and/or gelatin? no    Do you have a sensitivity to the preservative Thimersol? no    Do you have a past history of Guillan-Barre Syndrome? no    Do you currently have an acute febrile illness? no    Have you ever had a severe reaction to latex? no    Vaccine information given and explained to patient? yes   Immunizations Administered:  Influenza Vaccine # 1:    Vaccine Type: Fluvax Non-MCR    Site: right deltoid    Mfr: GlaxoSmithKline    Dose: 0.5 ml    Route: IM    Given by: Wendall Mola CMA ( AAMA)    Exp. Date: 08/15/2010    Lot #: [id number]    VIS given: 09/09/09 version given April 29, 2010.        Medication Adherence: 04/29/2010   Adherence to medications reviewed with patient. Counseling to provide adequate adherence provided   Prevention For Positives: 04/29/2010   Safe sex practices discussed with patient. Condoms offered.   Education Materials Provided: 04/29/2010 Safe sex practices discussed with patient. Condoms offered.

## 2010-05-23 LAB — T-HELPER CELL (CD4) - (RCID CLINIC ONLY): CD4 T Cell Abs: 700 uL (ref 400–2700)

## 2010-09-14 ENCOUNTER — Other Ambulatory Visit: Payer: Self-pay | Admitting: *Deleted

## 2010-09-14 DIAGNOSIS — B20 Human immunodeficiency virus [HIV] disease: Secondary | ICD-10-CM

## 2010-09-14 MED ORDER — EFAVIRENZ-EMTRICITAB-TENOFOVIR 600-200-300 MG PO TABS: 1.0000 | ORAL_TABLET | Freq: Every day | ORAL | Status: DC

## 2010-11-04 ENCOUNTER — Other Ambulatory Visit (INDEPENDENT_AMBULATORY_CARE_PROVIDER_SITE_OTHER): Payer: BC Managed Care – PPO

## 2010-11-04 ENCOUNTER — Other Ambulatory Visit: Payer: Self-pay | Admitting: Infectious Disease

## 2010-11-04 ENCOUNTER — Other Ambulatory Visit: Payer: Self-pay

## 2010-11-04 DIAGNOSIS — B20 Human immunodeficiency virus [HIV] disease: Secondary | ICD-10-CM

## 2010-11-05 LAB — CBC WITH DIFFERENTIAL/PLATELET
Eosinophils Relative: 2 % (ref 0–5)
MCH: 32.8 pg (ref 26.0–34.0)
MCHC: 33.4 g/dL (ref 30.0–36.0)
Monocytes Relative: 5 % (ref 3–12)
Neutro Abs: 5.8 10*3/uL (ref 1.7–7.7)
Neutrophils Relative %: 69 % (ref 43–77)
RBC: 4.21 MIL/uL — ABNORMAL LOW (ref 4.22–5.81)
RDW: 13.7 % (ref 11.5–15.5)

## 2010-11-05 LAB — COMPLETE METABOLIC PANEL WITH GFR
ALT: 16 U/L (ref 0–53)
BUN: 9 mg/dL (ref 6–23)
CO2: 27 mEq/L (ref 19–32)
Calcium: 9.2 mg/dL (ref 8.4–10.5)
GFR, Est Non African American: >60 mL/min (ref >60)
Sodium: 139 mEq/L (ref 135–145)

## 2010-11-12 LAB — T-HELPER CELL (CD4) - (RCID CLINIC ONLY): CD4 T Cell Abs: 570

## 2010-11-17 LAB — T-HELPER CELL (CD4) - (RCID CLINIC ONLY): CD4 T Cell Abs: 630

## 2010-11-18 ENCOUNTER — Encounter: Payer: Self-pay | Admitting: Infectious Disease

## 2010-11-18 ENCOUNTER — Encounter (INDEPENDENT_AMBULATORY_CARE_PROVIDER_SITE_OTHER): Payer: BC Managed Care – PPO | Admitting: Infectious Disease

## 2010-11-18 ENCOUNTER — Encounter: Payer: Self-pay | Admitting: *Deleted

## 2010-11-18 VITALS — BP 128/71 | HR 80 | Temp 98.1°F | Ht 76.0 in | Wt 199.2 lb

## 2010-11-18 DIAGNOSIS — L0292 Furuncle, unspecified: Secondary | ICD-10-CM

## 2010-11-18 DIAGNOSIS — G43909 Migraine, unspecified, not intractable, without status migrainosus: Secondary | ICD-10-CM

## 2010-11-18 DIAGNOSIS — Z23 Encounter for immunization: Secondary | ICD-10-CM

## 2010-11-18 DIAGNOSIS — B20 Human immunodeficiency virus [HIV] disease: Secondary | ICD-10-CM

## 2010-11-18 MED ORDER — SUMATRIPTAN SUCCINATE 50 MG PO TABS: 50.0000 mg | ORAL_TABLET | ORAL | Status: DC | PRN

## 2010-12-25 ENCOUNTER — Other Ambulatory Visit: Payer: Self-pay | Admitting: *Deleted

## 2010-12-25 DIAGNOSIS — J302 Other seasonal allergic rhinitis: Secondary | ICD-10-CM

## 2010-12-25 MED ORDER — FLUTICASONE FUROATE 27.5 MCG/SPRAY NA SUSP: 2.0000 | Freq: Every day | NASAL | Status: DC

## 2011-03-29 ENCOUNTER — Other Ambulatory Visit: Payer: Self-pay | Admitting: Infectious Disease

## 2011-08-03 ENCOUNTER — Telehealth: Payer: Self-pay | Admitting: *Deleted

## 2011-08-03 NOTE — Telephone Encounter (Signed)
I left him a message to please call back & schedule an appt

## 2011-10-07 ENCOUNTER — Other Ambulatory Visit: Payer: BC Managed Care – PPO

## 2011-10-07 DIAGNOSIS — G43909 Migraine, unspecified, not intractable, without status migrainosus: Secondary | ICD-10-CM

## 2011-10-07 LAB — CBC WITH DIFFERENTIAL/PLATELET
Basophils Absolute: 0 10*3/uL (ref 0.0–0.1)
HCT: 40.5 % (ref 39.0–52.0)
Lymphocytes Relative: 22 % (ref 12–46)
Lymphs Abs: 1.7 10*3/uL (ref 0.7–4.0)
Monocytes Absolute: 0.5 10*3/uL (ref 0.1–1.0)
Neutro Abs: 5.2 10*3/uL (ref 1.7–7.7)
RBC: 4.36 MIL/uL (ref 4.22–5.81)
RDW: 13.9 % (ref 11.5–15.5)
WBC: 7.6 10*3/uL (ref 4.0–10.5)

## 2011-10-07 LAB — COMPLETE METABOLIC PANEL WITH GFR
ALT: 13 U/L (ref 0–53)
AST: 12 U/L (ref 0–37)
Albumin: 4.4 g/dL (ref 3.5–5.2)
BUN: 10 mg/dL (ref 6–23)
Calcium: 9.4 mg/dL (ref 8.4–10.5)
Chloride: 104 mEq/L (ref 96–112)
Potassium: 4.1 mEq/L (ref 3.5–5.3)

## 2011-10-13 ENCOUNTER — Other Ambulatory Visit: Payer: Self-pay | Admitting: Infectious Disease

## 2011-10-25 ENCOUNTER — Ambulatory Visit: Payer: BC Managed Care – PPO | Admitting: Infectious Disease

## 2011-11-03 ENCOUNTER — Ambulatory Visit (INDEPENDENT_AMBULATORY_CARE_PROVIDER_SITE_OTHER): Payer: BC Managed Care – PPO | Admitting: Infectious Disease

## 2011-11-03 ENCOUNTER — Encounter: Payer: Self-pay | Admitting: Infectious Disease

## 2011-11-03 VITALS — BP 124/81 | HR 78 | Temp 98.4°F | Ht 76.0 in | Wt 184.0 lb

## 2011-11-03 DIAGNOSIS — J301 Allergic rhinitis due to pollen: Secondary | ICD-10-CM

## 2011-11-03 DIAGNOSIS — F17200 Nicotine dependence, unspecified, uncomplicated: Secondary | ICD-10-CM

## 2011-11-03 DIAGNOSIS — Z23 Encounter for immunization: Secondary | ICD-10-CM

## 2011-11-03 DIAGNOSIS — B20 Human immunodeficiency virus [HIV] disease: Secondary | ICD-10-CM

## 2011-11-03 MED ORDER — FLUTICASONE PROPIONATE 50 MCG/ACT NA SUSP: 2.0000 | Freq: Every day | NASAL | Status: DC

## 2011-11-03 NOTE — Assessment & Plan Note (Signed)
Changed to fluticasone propionate

## 2011-11-03 NOTE — Progress Notes (Signed)
  Subjective:    Patient ID: Calvin Rush, male    DOB: January 15, 1971, 41 y.o.   MRN: [id number]  HPI  Calvin Rush is a 41 y.o. male who is doing superbly well on his  antiviral regimen, Atripla with undetectable viral load and health cd4 count. He states that his nasal steroid inhaler seemed to work and was well at may be related to the dose of the fluticasone and the preparation I changed him over to the [facility] preparation. I've encouraged him to stop smoking but he is having a difficult time doing so and is reluctant to engage in any further meds to help with this.   Review of Systems  Constitutional: Negative for fever, chills, diaphoresis, activity change, appetite change, fatigue and unexpected weight change.  HENT: Negative for congestion, sore throat, rhinorrhea, sneezing, trouble swallowing and sinus pressure.   Eyes: Negative for photophobia and visual disturbance.  Respiratory: Negative for cough, chest tightness, shortness of breath, wheezing and stridor.   Cardiovascular: Negative for chest pain, palpitations and leg swelling.  Gastrointestinal: Negative for nausea, vomiting, abdominal pain, diarrhea, constipation, blood in stool, abdominal distention and anal bleeding.  Genitourinary: Negative for dysuria, hematuria, flank pain and difficulty urinating.  Musculoskeletal: Negative for myalgias, back pain, joint swelling, arthralgias and gait problem.  Skin: Negative for color change, pallor, rash and wound.  Neurological: Negative for dizziness, tremors, weakness and light-headedness.  Hematological: Negative for adenopathy. Does not bruise/bleed easily.  Psychiatric/Behavioral: Negative for behavioral problems, confusion, disturbed wake/sleep cycle, dysphoric mood, decreased concentration and agitation.       Objective:   Physical Exam  Constitutional: He is oriented to person, place, and time. He appears well-developed and well-nourished. No distress.  HENT:  Head:  Normocephalic and atraumatic.  Mouth/Throat: Oropharynx is clear and moist. No oropharyngeal exudate.  Eyes: Conjunctivae normal and EOM are normal. Pupils are equal, round, and reactive to light. No scleral icterus.  Neck: Normal range of motion. Neck supple. No JVD present.  Cardiovascular: Normal rate, regular rhythm and normal heart sounds.  Exam reveals no gallop and no friction rub.   No murmur heard. Pulmonary/Chest: Effort normal and breath sounds normal. No respiratory distress. He has no wheezes. He has no rales. He exhibits no tenderness.  Abdominal: He exhibits no distension and no mass. There is no tenderness. There is no rebound and no guarding.  Musculoskeletal: He exhibits no edema and no tenderness.  Lymphadenopathy:    He has no cervical adenopathy.  Neurological: He is alert and oriented to person, place, and time. He has normal reflexes. He exhibits normal muscle tone. Coordination normal.  Skin: Skin is warm and dry. He is not diaphoretic. No erythema. No pallor.  Psychiatric: He has a normal mood and affect. His behavior is normal. Judgment and thought content normal.          Assessment & Plan:  HIV DISEASE Perfect control continue atripla  ALLERGIC RHINITIS, SEASONAL Changed to fluticasone propionate  SMOKER Counseled to quit.

## 2011-11-03 NOTE — Assessment & Plan Note (Signed)
Counseled to quit 

## 2011-11-03 NOTE — Assessment & Plan Note (Signed)
Perfect control continue atripla

## 2012-05-01 ENCOUNTER — Other Ambulatory Visit: Payer: Self-pay | Admitting: Infectious Disease

## 2012-06-21 ENCOUNTER — Other Ambulatory Visit: Payer: BC Managed Care – PPO

## 2012-07-03 ENCOUNTER — Other Ambulatory Visit: Payer: BC Managed Care – PPO

## 2012-07-03 ENCOUNTER — Other Ambulatory Visit: Payer: Self-pay | Admitting: Infectious Disease

## 2012-07-03 DIAGNOSIS — B20 Human immunodeficiency virus [HIV] disease: Secondary | ICD-10-CM

## 2012-07-03 LAB — CBC WITH DIFFERENTIAL/PLATELET
Basophils Absolute: 0 10*3/uL (ref 0.0–0.1)
Eosinophils Relative: 5 % (ref 0–5)
Lymphocytes Relative: 29 % (ref 12–46)
Lymphs Abs: 1.8 10*3/uL (ref 0.7–4.0)
Neutro Abs: 3.6 10*3/uL (ref 1.7–7.7)
Neutrophils Relative %: 57 % (ref 43–77)
Platelets: 235 10*3/uL (ref 150–400)
RBC: 4.21 MIL/uL — ABNORMAL LOW (ref 4.22–5.81)
RDW: 14.1 % (ref 11.5–15.5)
WBC: 6.2 10*3/uL (ref 4.0–10.5)

## 2012-07-03 LAB — COMPLETE METABOLIC PANEL WITH GFR
ALT: 14 U/L (ref 0–53)
Albumin: 4.4 g/dL (ref 3.5–5.2)
Alkaline Phosphatase: 59 U/L (ref 39–117)
GFR, Est Non African American: >89 mL/min
Glucose, Bld: 80 mg/dL (ref 70–99)
Potassium: 4.2 mEq/L (ref 3.5–5.3)
Sodium: 138 mEq/L (ref 135–145)
Total Bilirubin: 0.4 mg/dL (ref 0.3–1.2)
Total Protein: 7 g/dL (ref 6.0–8.3)

## 2012-07-03 LAB — LIPID PANEL
HDL: 37 mg/dL — ABNORMAL LOW (ref >39)
LDL Cholesterol: 93 mg/dL (ref 0–99)
Total CHOL/HDL Ratio: 4.3 Ratio
VLDL: 29 mg/dL (ref 0–40)

## 2012-07-04 LAB — RPR

## 2012-07-04 LAB — T-HELPER CELL (CD4) - (RCID CLINIC ONLY)
CD4 % Helper T Cell: 38 % (ref 33–55)
CD4 T Cell Abs: 690 uL (ref 400–2700)

## 2012-07-05 ENCOUNTER — Ambulatory Visit: Payer: BC Managed Care – PPO | Admitting: Infectious Disease

## 2012-07-05 LAB — HIV-1 RNA QUANT-NO REFLEX-BLD: HIV-1 RNA Quant, Log: <1.3 {Log} (ref <1.30)

## 2012-07-12 ENCOUNTER — Encounter: Payer: Self-pay | Admitting: Infectious Disease

## 2012-07-12 ENCOUNTER — Ambulatory Visit (INDEPENDENT_AMBULATORY_CARE_PROVIDER_SITE_OTHER): Payer: BC Managed Care – PPO | Admitting: Infectious Disease

## 2012-07-12 VITALS — BP 117/72 | HR 81 | Temp 98.6°F | Ht 76.0 in | Wt 187.0 lb

## 2012-07-12 DIAGNOSIS — Z21 Asymptomatic human immunodeficiency virus [HIV] infection status: Secondary | ICD-10-CM | POA: Insufficient documentation

## 2012-07-12 DIAGNOSIS — J302 Other seasonal allergic rhinitis: Secondary | ICD-10-CM | POA: Insufficient documentation

## 2012-07-12 DIAGNOSIS — F17200 Nicotine dependence, unspecified, uncomplicated: Secondary | ICD-10-CM

## 2012-07-12 DIAGNOSIS — F341 Dysthymic disorder: Secondary | ICD-10-CM

## 2012-07-12 DIAGNOSIS — B20 Human immunodeficiency virus [HIV] disease: Secondary | ICD-10-CM

## 2012-07-12 DIAGNOSIS — F32A Depression, unspecified: Secondary | ICD-10-CM

## 2012-07-12 DIAGNOSIS — F329 Major depressive disorder, single episode, unspecified: Secondary | ICD-10-CM

## 2012-07-12 DIAGNOSIS — G47419 Narcolepsy without cataplexy: Secondary | ICD-10-CM

## 2012-07-12 MED ORDER — BUPROPION HCL ER (SMOKING DET) 150 MG PO TB12: 150.0000 mg | ORAL_TABLET | Freq: Two times a day (BID) | ORAL | Status: AC

## 2012-07-12 NOTE — Progress Notes (Signed)
  Subjective:    Patient ID: Calvin Rush, male    DOB: August 02, 1970, 42 y.o.   MRN: [id number]  HPI   Danzel Marszalek is a 42 y.o. male who is doing superbly well on his  antiviral regimen. 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 I've offered to give him well be trimmed to assist with treating his anxiety and also with his desire to smoke. In the past he took Chantix which induces severe depression.   Review of Systems  Constitutional: Negative for fever, chills, diaphoresis, activity change, appetite change, fatigue and unexpected weight change.  HENT: Negative for congestion, sore throat, rhinorrhea, sneezing, trouble swallowing and sinus pressure.   Eyes: Negative for photophobia and visual disturbance.  Respiratory: Negative for cough, chest tightness, shortness of breath, wheezing and stridor.   Cardiovascular: Negative for chest pain, palpitations and leg swelling.  Gastrointestinal: Negative for nausea, vomiting, abdominal pain, diarrhea, constipation, blood in stool, abdominal distention and anal bleeding.  Genitourinary: Negative for dysuria, hematuria, flank pain and difficulty urinating.  Musculoskeletal: Negative for myalgias, back pain, joint swelling, arthralgias and gait problem.  Skin: Negative for color change, pallor, rash and wound.  Neurological: Negative for dizziness, tremors, weakness and light-headedness.  Hematological: Negative for adenopathy. Does not bruise/bleed easily.  Psychiatric/Behavioral: Negative for behavioral problems, confusion, sleep disturbance, dysphoric mood, decreased concentration and agitation.       Objective:   Physical Exam  Constitutional: He is oriented to person, place, and time. He appears well-developed and well-nourished. No distress.  HENT:  Head: Normocephalic and atraumatic.  Mouth/Throat: Oropharynx is clear and moist. No oropharyngeal  exudate.  Eyes: Conjunctivae and EOM are normal. Pupils are equal, round, and reactive to light. No scleral icterus.  Neck: Normal range of motion. Neck supple. No JVD present.  Cardiovascular: Normal rate, regular rhythm and normal heart sounds.  Exam reveals no gallop and no friction rub.   No murmur heard. Pulmonary/Chest: Effort normal and breath sounds normal. No respiratory distress. He has no wheezes. He has no rales. He exhibits no tenderness.  Abdominal: He exhibits no distension and no mass. There is no tenderness. There is no rebound and no guarding.  Musculoskeletal: He exhibits no edema and no tenderness.  Lymphadenopathy:    He has no cervical adenopathy.  Neurological: He is alert and oriented to person, place, and time. He has normal reflexes. He exhibits normal muscle tone. Coordination normal.  Skin: Skin is warm and dry. He is not diaphoretic. No erythema. No pallor.  Psychiatric: He has a normal mood and affect. His behavior is normal. Judgment and thought content normal.          Assessment & Plan:  HIV: Continue Atripla recheck viral load and CD4 count in 6 months time.  Smoking: Start Wellbutrin which may help him with his smoking craving also the depression   Depression anxiety largely her work-related will start Wellbutrin as above.  Narcolepsy currently well controlled per patient.

## 2012-10-30 ENCOUNTER — Other Ambulatory Visit: Payer: BC Managed Care – PPO

## 2012-10-30 DIAGNOSIS — B20 Human immunodeficiency virus [HIV] disease: Secondary | ICD-10-CM

## 2012-10-30 LAB — CBC WITH DIFFERENTIAL/PLATELET
HCT: 43.1 % (ref 39.0–52.0)
Hemoglobin: 15.5 g/dL (ref 13.0–17.0)
Lymphs Abs: 1.8 10*3/uL (ref 0.7–4.0)
MCHC: 36 g/dL (ref 30.0–36.0)
Monocytes Absolute: 0.5 10*3/uL (ref 0.1–1.0)
Monocytes Relative: 9 % (ref 3–12)
Neutro Abs: 2.9 10*3/uL (ref 1.7–7.7)
Neutrophils Relative %: 52 % (ref 43–77)
RBC: 4.63 MIL/uL (ref 4.22–5.81)

## 2012-10-30 LAB — LIPID PANEL
Cholesterol: 193 mg/dL (ref 0–200)
HDL: 55 mg/dL (ref >39)
LDL Cholesterol: 121 mg/dL — ABNORMAL HIGH (ref 0–99)
Total CHOL/HDL Ratio: 3.5 Ratio
Triglycerides: 85 mg/dL (ref <150)
VLDL: 17 mg/dL (ref 0–40)

## 2012-10-30 LAB — COMPLETE METABOLIC PANEL WITH GFR
ALT: 17 U/L (ref 0–53)
Alkaline Phosphatase: 65 U/L (ref 39–117)
CO2: 28 mEq/L (ref 19–32)
Creat: 1.06 mg/dL (ref 0.50–1.35)
GFR, Est African American: >89 mL/min
GFR, Est Non African American: 86 mL/min
Glucose, Bld: 96 mg/dL (ref 70–99)
Sodium: 138 mEq/L (ref 135–145)
Total Bilirubin: 0.5 mg/dL (ref 0.3–1.2)
Total Protein: 7.7 g/dL (ref 6.0–8.3)

## 2012-10-31 LAB — HIV-1 RNA QUANT-NO REFLEX-BLD: HIV-1 RNA Quant, Log: <1.3 {Log} (ref <1.30)

## 2012-11-04 ENCOUNTER — Other Ambulatory Visit: Payer: Self-pay | Admitting: Infectious Disease

## 2012-11-14 ENCOUNTER — Other Ambulatory Visit: Payer: Self-pay | Admitting: Infectious Disease

## 2012-11-14 ENCOUNTER — Ambulatory Visit: Payer: BC Managed Care – PPO | Admitting: Infectious Disease

## 2012-11-20 ENCOUNTER — Encounter: Payer: Self-pay | Admitting: Infectious Disease

## 2012-11-20 ENCOUNTER — Ambulatory Visit (INDEPENDENT_AMBULATORY_CARE_PROVIDER_SITE_OTHER): Payer: BC Managed Care – PPO | Admitting: Infectious Disease

## 2012-11-20 VITALS — BP 139/77 | Temp 97.0°F | Wt 187.9 lb

## 2012-11-20 DIAGNOSIS — F329 Major depressive disorder, single episode, unspecified: Secondary | ICD-10-CM

## 2012-11-20 DIAGNOSIS — F3289 Other specified depressive episodes: Secondary | ICD-10-CM

## 2012-11-20 DIAGNOSIS — B20 Human immunodeficiency virus [HIV] disease: Secondary | ICD-10-CM

## 2012-11-20 DIAGNOSIS — Z23 Encounter for immunization: Secondary | ICD-10-CM

## 2012-11-20 DIAGNOSIS — F17200 Nicotine dependence, unspecified, uncomplicated: Secondary | ICD-10-CM

## 2012-11-20 MED ORDER — EFAVIRENZ-EMTRICITAB-TENOFOVIR 600-200-300 MG PO TABS: ORAL_TABLET | ORAL | Status: AC

## 2012-11-20 MED ORDER — EFAVIRENZ-EMTRICITAB-TENOFOVIR 600-200-300 MG PO TABS: ORAL_TABLET | ORAL | Status: DC

## 2012-11-20 NOTE — Progress Notes (Signed)
  Subjective:    Patient ID: Calvin Rush, male    DOB: [birth year], 42 y.o.   MRN: [id number]  HPI   Calvin Rush is a 42 y.o. male who is doing superbly well on his  antiviral regimen, Atripla.    Discussed other possible single tablet regimens but he would like to remain on this regimen I think especially now that he is moving to [place] with his partner who is not a viable to change regimens at this point time I refilled his Atripla with 11 refills. I have shown him how to look for line wife on the clinic's using the [facility] [employer] and [employer] in [place]. There is a clinic near [place]  which appears to be one that can provide HIV care in [place] to several clinics in [place] including one associated with memory   Review of Systems  Constitutional: Negative for fever, chills, diaphoresis, activity change, appetite change, fatigue and unexpected weight change.  HENT: Negative for congestion, sore throat, rhinorrhea, sneezing, trouble swallowing and sinus pressure.   Eyes: Negative for photophobia and visual disturbance.  Respiratory: Negative for cough, chest tightness, shortness of breath, wheezing and stridor.   Cardiovascular: Negative for chest pain, palpitations and leg swelling.  Gastrointestinal: Negative for nausea, vomiting, abdominal pain, diarrhea, constipation, blood in stool, abdominal distention and anal bleeding.  Genitourinary: Negative for dysuria, hematuria, flank pain and difficulty urinating.  Musculoskeletal: Negative for myalgias, back pain, joint swelling, arthralgias and gait problem.  Skin: Negative for color change, pallor, rash and wound.  Neurological: Negative for dizziness, tremors, weakness and light-headedness.  Hematological: Negative for adenopathy. Does not bruise/bleed easily.  Psychiatric/Behavioral: Negative for behavioral problems, confusion, sleep disturbance, dysphoric mood, decreased concentration and agitation.       Objective:   Physical Exam  Constitutional: He is oriented to person, place, and time. He appears well-developed and well-nourished. No distress.  HENT:  Head: Normocephalic and atraumatic.  Mouth/Throat: Oropharynx is clear and moist. No oropharyngeal exudate.  Eyes: Conjunctivae and EOM are normal. Pupils are equal, round, and reactive to light. No scleral icterus.  Neck: Normal range of motion. Neck supple. No JVD present.  Cardiovascular: Normal rate, regular rhythm and normal heart sounds.  Exam reveals no gallop and no friction rub.   No murmur heard. Pulmonary/Chest: Effort normal and breath sounds normal. No respiratory distress. He has no wheezes. He has no rales. He exhibits no tenderness.  Abdominal: He exhibits no distension and no mass. There is no tenderness. There is no rebound and no guarding.  Musculoskeletal: He exhibits no edema and no tenderness.  Lymphadenopathy:    He has no cervical adenopathy.  Neurological: He is alert and oriented to person, place, and time. He has normal reflexes. He exhibits normal muscle tone. Coordination normal.  Skin: Skin is warm and dry. He is not diaphoretic. No erythema. No pallor.  Psychiatric: He has a normal mood and affect. His behavior is normal. Judgment and thought content normal.          Assessment & Plan:  HIV: Continue Atripla  Refills written, needs to be plugged into clinic in [place]  Smoking: continue  Wellbutrin which may help him with his smoking craving also the depression   Depression anxiety largely her work-related will start Wellbutrin as above.  Narcolepsy currently well controlled per patient.  HCM: flu vax

## 2013-05-21 ENCOUNTER — Telehealth: Payer: Self-pay | Admitting: *Deleted

## 2013-05-21 NOTE — Telephone Encounter (Signed)
Call from patient, he has [place]. Will sign a release at [facility] for medical records. Wendall MolaJacqueline Cockerham

## 2013-11-30 ENCOUNTER — Other Ambulatory Visit: Payer: Self-pay

## 2023-09-02 ENCOUNTER — Encounter: Payer: Self-pay | Admitting: Advanced Practice Midwife
